# Patient Record
Sex: Female | Born: 1965 | Race: White | Hispanic: No
Health system: Southern US, Community
[De-identification: ages and names within clinical notes are randomized; demographics above are authoritative.]

---

## 2005-07-28 ENCOUNTER — Ambulatory Visit: Payer: Self-pay | Admitting: Internal Medicine

## 2007-05-30 ENCOUNTER — Ambulatory Visit: Payer: Self-pay

## 2009-05-27 ENCOUNTER — Ambulatory Visit: Payer: Self-pay

## 2009-12-18 ENCOUNTER — Ambulatory Visit: Payer: Self-pay | Admitting: Internal Medicine

## 2009-12-19 ENCOUNTER — Ambulatory Visit: Payer: Self-pay | Admitting: Unknown Physician Specialty

## 2011-07-12 ENCOUNTER — Emergency Department: Payer: Self-pay | Admitting: Emergency Medicine

## 2011-07-12 LAB — CBC
MCH: 29.8 pg (ref 26.0–34.0)
MCHC: 34.2 g/dL (ref 32.0–36.0)
MCV: 87 fL (ref 80–100)
Platelet: 255 10*3/uL (ref 150–440)
RBC: 5.04 10*6/uL (ref 3.80–5.20)
RDW: 12.6 % (ref 11.5–14.5)
WBC: 9.3 10*3/uL (ref 3.6–11.0)

## 2011-07-12 LAB — URINALYSIS, COMPLETE
Bilirubin,UR: NEGATIVE
Blood: NEGATIVE
Glucose,UR: NEGATIVE mg/dL (ref 0–75)
Ketone: NEGATIVE
Leukocyte Esterase: NEGATIVE
Nitrite: NEGATIVE
Ph: 5 (ref 4.5–8.0)
RBC,UR: 1 /HPF (ref 0–5)
Specific Gravity: 1.03 (ref 1.003–1.030)
Squamous Epithelial: 8

## 2011-07-12 LAB — BASIC METABOLIC PANEL
Chloride: 103 mmol/L (ref 98–107)
Co2: 29 mmol/L (ref 21–32)
Creatinine: 0.81 mg/dL (ref 0.60–1.30)
EGFR (African American): >60
EGFR (Non-African Amer.): >60
Sodium: 139 mmol/L (ref 136–145)

## 2011-07-26 ENCOUNTER — Ambulatory Visit: Payer: Self-pay | Admitting: Urology

## 2011-08-08 ENCOUNTER — Ambulatory Visit: Payer: Self-pay | Admitting: Urology

## 2017-01-16 ENCOUNTER — Ambulatory Visit: Payer: Self-pay

## 2017-10-04 ENCOUNTER — Ambulatory Visit: Payer: Self-pay | Attending: Oncology

## 2017-10-04 ENCOUNTER — Ambulatory Visit
Admission: RE | Admit: 2017-10-04 | Discharge: 2017-10-04 | Disposition: A | Discharge status: Home / Self Care | Payer: Self-pay | Source: Ambulatory Visit | Attending: Oncology | Admitting: Oncology

## 2017-10-04 ENCOUNTER — Encounter (INDEPENDENT_AMBULATORY_CARE_PROVIDER_SITE_OTHER): Payer: Self-pay

## 2017-10-04 VITALS — BP 119/80 | HR 73 | Temp 97.8°F | Resp 18 | Ht 68.0 in | Wt 137.0 lb

## 2017-10-04 DIAGNOSIS — Z0000 Encounter for general adult medical examination without abnormal findings: Secondary | ICD-10-CM

## 2017-10-04 DIAGNOSIS — Z1231 Encounter for screening mammogram for malignant neoplasm of breast: Secondary | ICD-10-CM | POA: Insufficient documentation

## 2017-10-04 NOTE — Progress Notes (Signed)
Sent for bilateral screening mammogram.  Letter mailed from [facility] to notify of normal mammogram results.  Patient to return in one year for annual screening.  Copy to [facility]. 

## 2017-10-04 NOTE — Progress Notes (Signed)
  Subjective:     Patient ID: Norma Johnson, female   DOB: 1966-02-04, 52 y.o.   MRN: [id number]  HPI   Review of Systems     Objective:   Physical Exam  Pulmonary/Chest: Right breast exhibits no inverted nipple, no mass, no nipple discharge, no skin change and no tenderness. Left breast exhibits no inverted nipple, no mass, no nipple discharge, no skin change and no tenderness. Breasts are symmetrical.       Assessment:     52 year old patient presents for [facility] clinic visit.  Patient screened, and meets [facility] eligibility.  Patient does not have insurance, Medicare or Medicaid.  Handout given on Affordable Care Act.  Instructed patient on breast self awareness using teach back method.  Clinical breast exam unremarkable.  Patient had a negative/negative pap at [facility] 08/24/17.      Plan:     Sent for bilateral screening mammogram.

## 2019-11-04 IMAGING — MG MM DIGITAL SCREENING BILAT W/ TOMO W/ CAD
8 series · 9 of 24 positions shown · non-contrast
Comparison: Previous exam(s).

CLINICAL DATA: Screening.

EXAM:
DIGITAL SCREENING BILATERAL MAMMOGRAM WITH TOMO AND CAD

[L CC synth-2D]
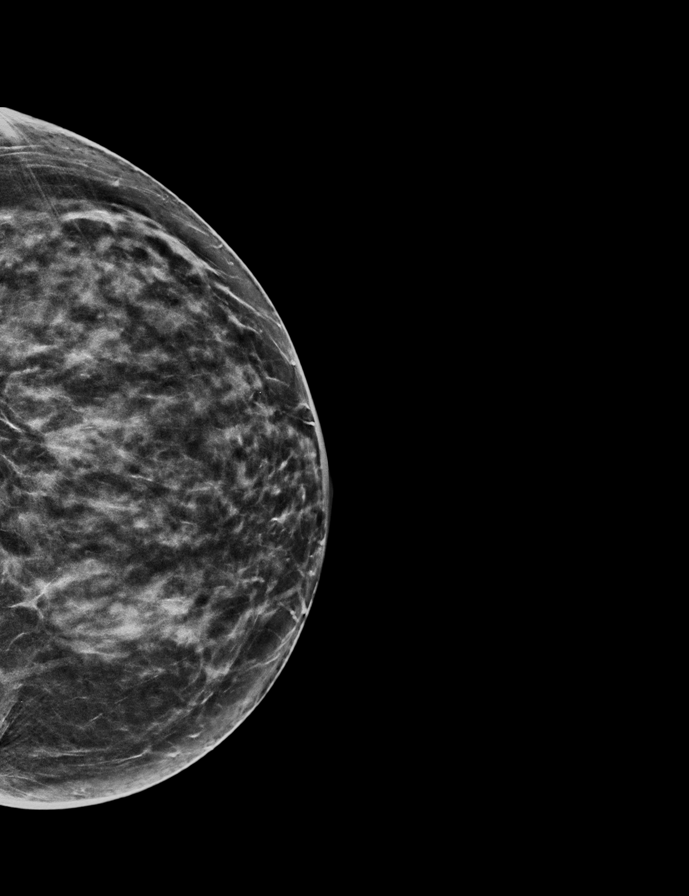

[R MLO synth-2D]
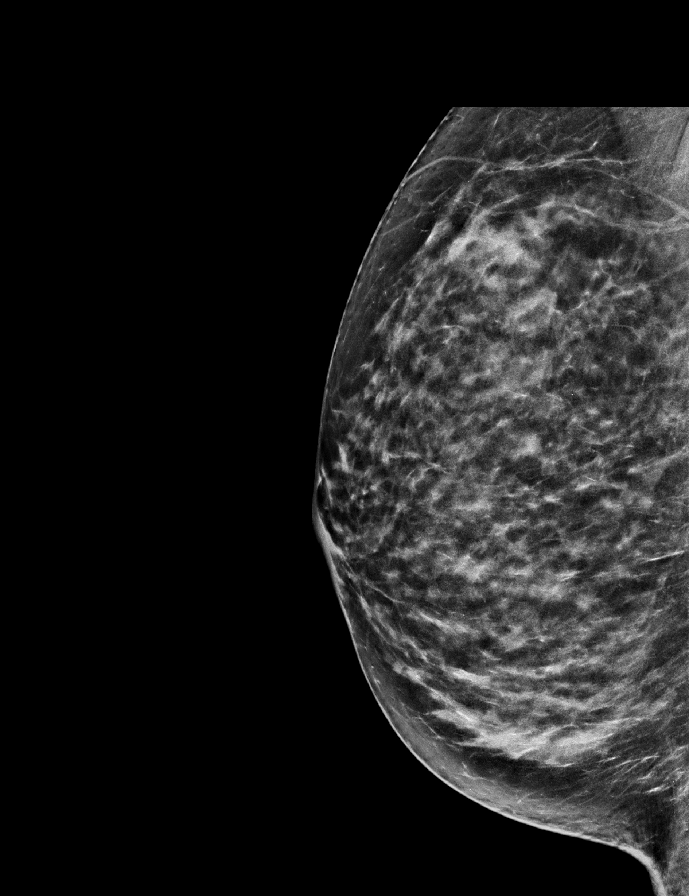

[R CC synth-2D]
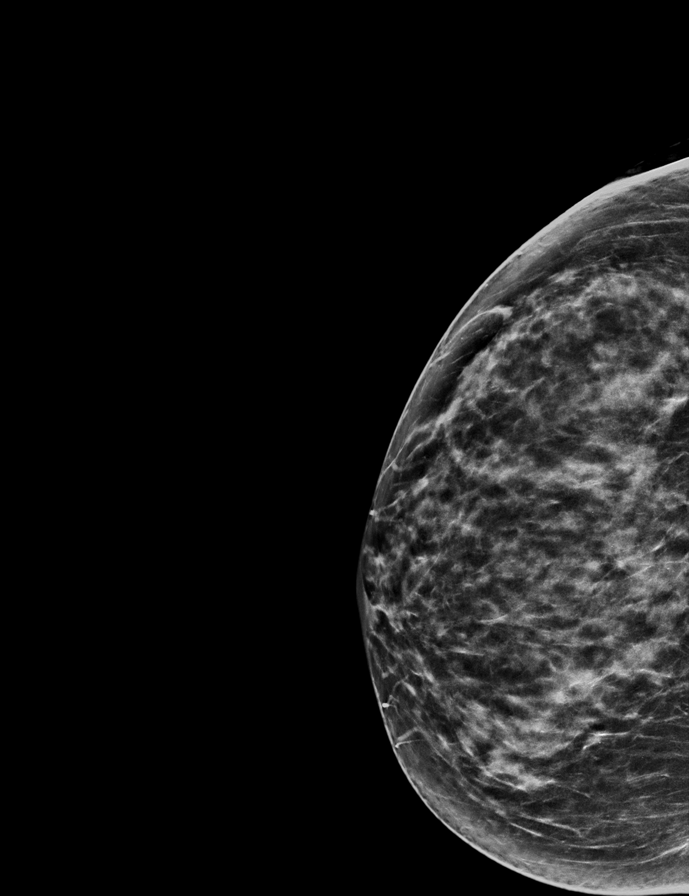

[L MLO synth-2D]
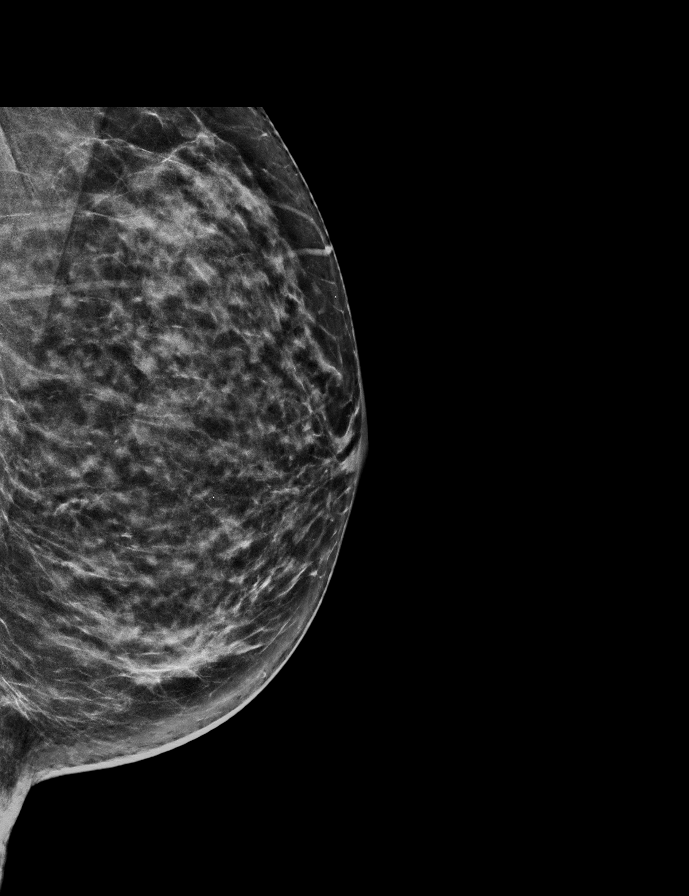

[R CC tomo · 2 of 64 frames shown]
[frame 21/64]
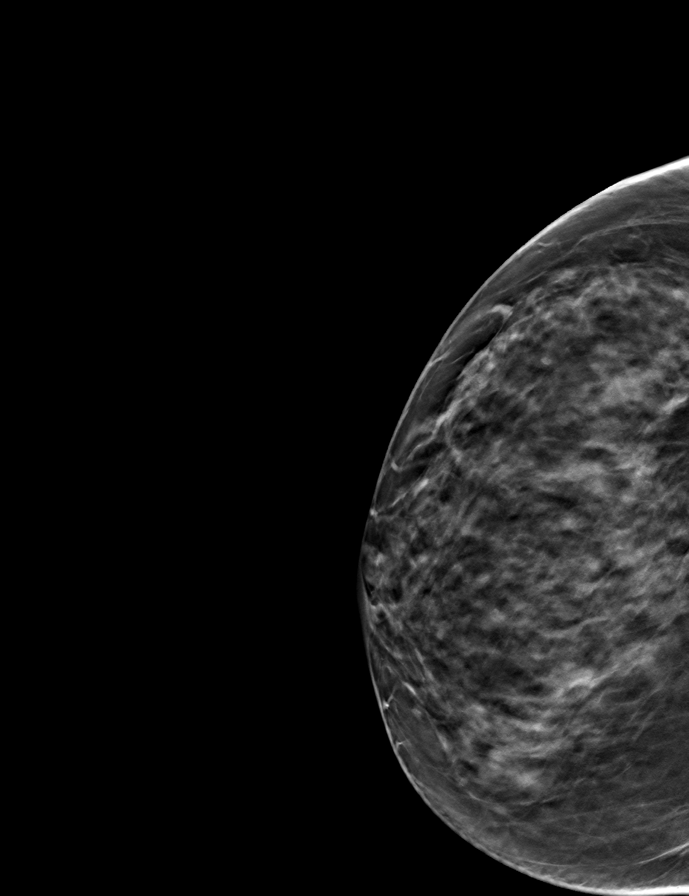
[frame 33/64]
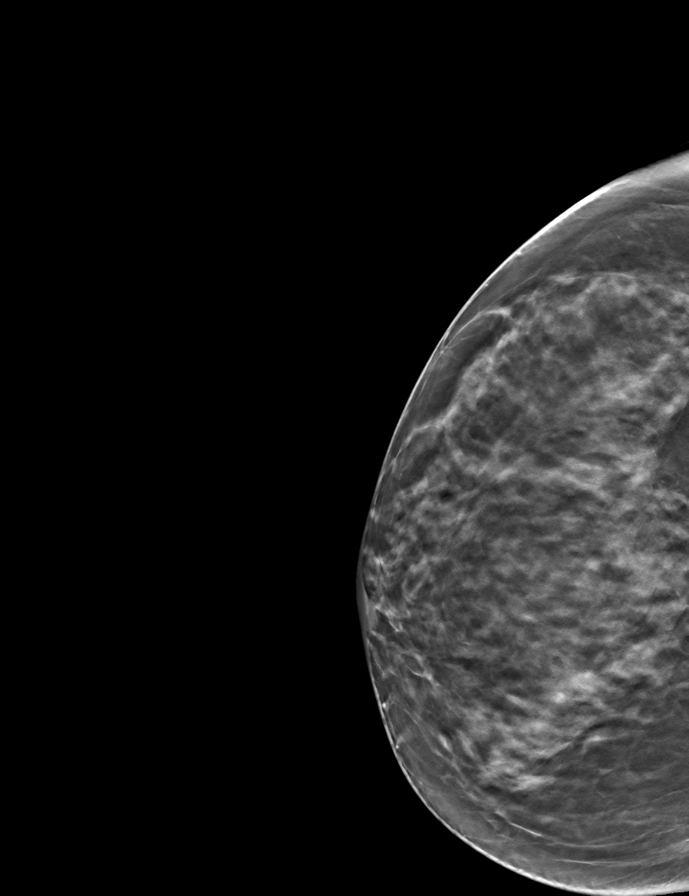

[L MLO tomo · tomo slice 32/63.0]
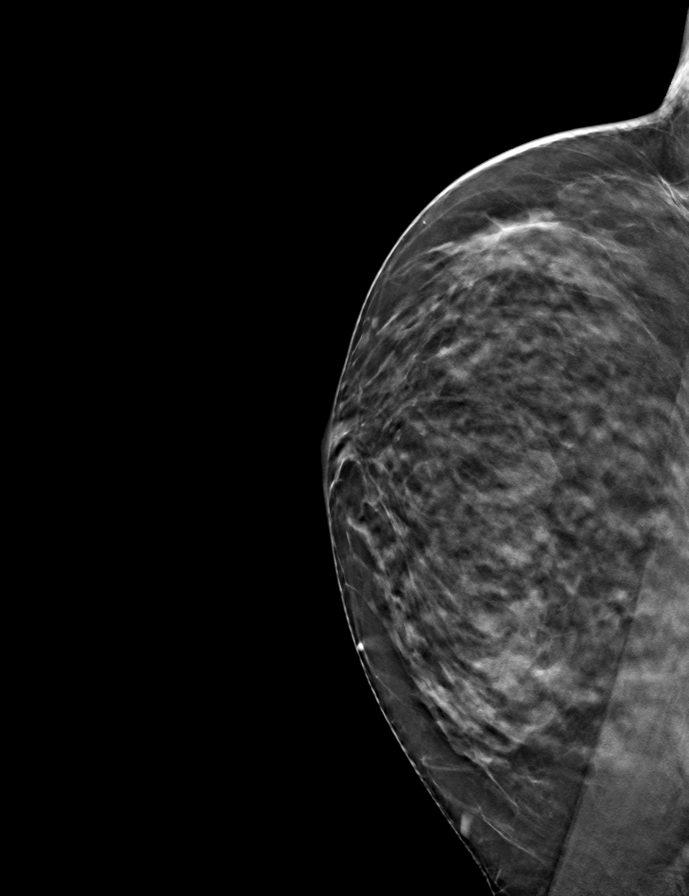

[L CC tomo · tomo slice 34/67.0]
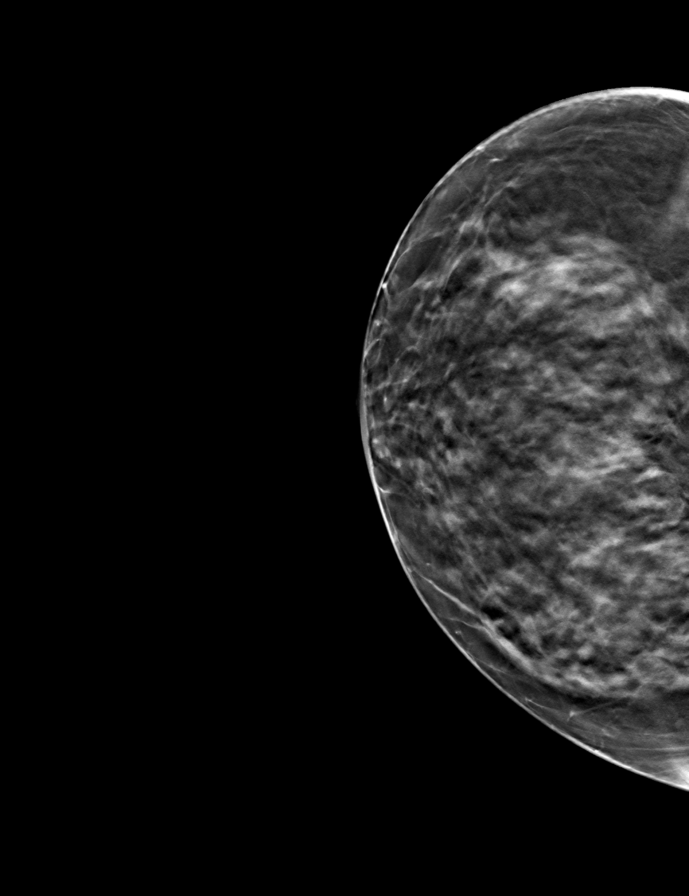

[R MLO tomo · tomo slice 31/61.0]
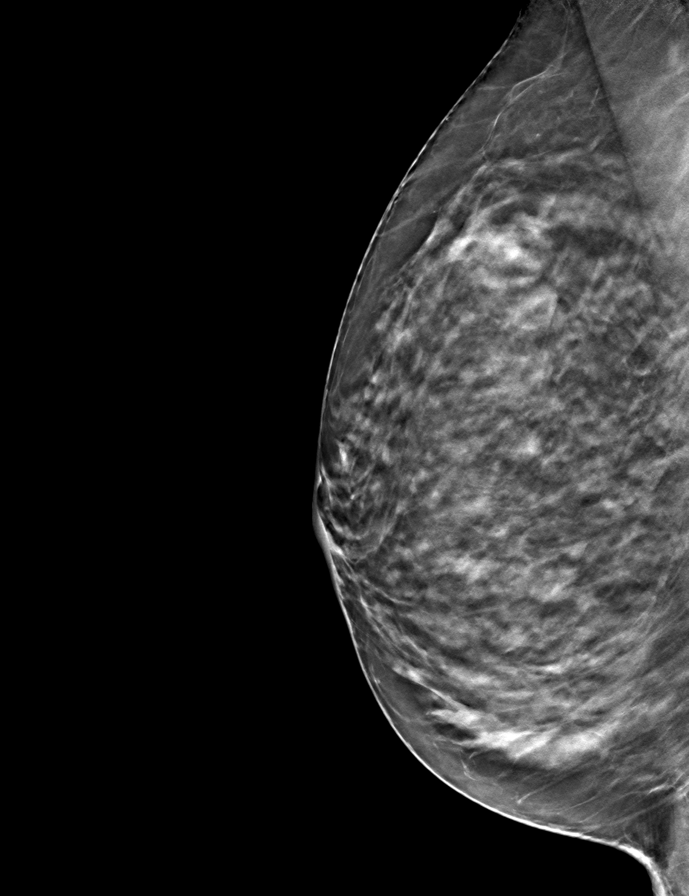

[9 of 24 positions shown; findings below may reference images not displayed]

ACR Breast Density Category c: The breast tissue is heterogeneously
dense, which may obscure small masses.
FINDINGS: There are no findings suspicious for malignancy. Images were
processed with CAD.
IMPRESSION: No mammographic evidence of malignancy. A result letter of this
screening mammogram will be mailed directly to the patient.

RECOMMENDATION:
Screening mammogram in one year. (Code:[id number])

BI-RADS CATEGORY  1: Negative.
